# Patient Record
Sex: Male | Born: 1995 | Race: White | Hispanic: No | Marital: Single | Smoking: Current every day smoker
Health system: Southern US, Community
[De-identification: ages and names within clinical notes are randomized; demographics above are authoritative.]

## PROBLEM LIST (undated history)

## (undated) DIAGNOSIS — W5503XA Scratched by cat, initial encounter: Secondary | ICD-10-CM

## (undated) DIAGNOSIS — R12 Heartburn: Secondary | ICD-10-CM

## (undated) DIAGNOSIS — S022XXA Fracture of nasal bones, initial encounter for closed fracture: Secondary | ICD-10-CM

## (undated) HISTORY — PX: LACERATION REPAIR: SHX5168

## (undated) HISTORY — PX: HERNIA REPAIR: SHX51

## (undated) HISTORY — PX: WISDOM TOOTH EXTRACTION: SHX21

---
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
## 2014-02-09 ENCOUNTER — Emergency Department (HOSPITAL_COMMUNITY)
Admission: EM | Admit: 2014-02-09 | Discharge: 2014-02-09 | Disposition: A | Discharge status: Home / Self Care | Payer: Medicaid Other | Attending: Emergency Medicine | Admitting: Emergency Medicine

## 2014-02-09 ENCOUNTER — Encounter (HOSPITAL_COMMUNITY): Payer: Self-pay | Admitting: Emergency Medicine

## 2014-02-09 DIAGNOSIS — S01511A Laceration without foreign body of lip, initial encounter: Secondary | ICD-10-CM

## 2014-02-09 DIAGNOSIS — Y9239 Other specified sports and athletic area as the place of occurrence of the external cause: Secondary | ICD-10-CM | POA: Insufficient documentation

## 2014-02-09 DIAGNOSIS — Y9371 Activity, boxing: Secondary | ICD-10-CM | POA: Insufficient documentation

## 2014-02-09 DIAGNOSIS — S01501A Unspecified open wound of lip, initial encounter: Secondary | ICD-10-CM | POA: Insufficient documentation

## 2014-02-09 DIAGNOSIS — Z8781 Personal history of (healed) traumatic fracture: Secondary | ICD-10-CM | POA: Insufficient documentation

## 2014-02-09 DIAGNOSIS — Y92838 Other recreation area as the place of occurrence of the external cause: Secondary | ICD-10-CM

## 2014-02-09 DIAGNOSIS — W219XXA Striking against or struck by unspecified sports equipment, initial encounter: Secondary | ICD-10-CM | POA: Insufficient documentation

## 2014-02-09 DIAGNOSIS — F17200 Nicotine dependence, unspecified, uncomplicated: Secondary | ICD-10-CM | POA: Insufficient documentation

## 2014-02-09 DIAGNOSIS — Z9889 Other specified postprocedural states: Secondary | ICD-10-CM | POA: Insufficient documentation

## 2014-02-09 MED ORDER — CLINDAMYCIN HCL 150 MG PO CAPS: 300.0000 mg | ORAL_CAPSULE | Freq: Three times a day (TID) | ORAL | Status: DC

## 2014-02-09 MED ORDER — TETANUS-DIPHTH-ACELL PERTUSSIS 5-2.5-18.5 LF-MCG/0.5 IM SUSP: 0.5000 mL | Freq: Once | INTRAMUSCULAR | Status: DC

## 2014-02-09 NOTE — ED Notes (Signed)
Pt comfortable with discharge and follow up instructions. Prescriptions x1. Pt declines wheelchair, escorted to waiting area. 

## 2014-02-09 NOTE — Discharge Instructions (Signed)
You need to have your sutures removed in 5 days.  Facial Laceration  A facial laceration is a cut on the face. These injuries can be painful and cause bleeding. Lacerations usually heal quickly, but they need special care to reduce scarring. DIAGNOSIS  Your health care provider will take a medical history, ask for details about how the injury occurred, and examine the wound to determine how deep the cut is. TREATMENT  Some facial lacerations may not require closure. Others may not be able to be closed because of an increased risk of infection. The risk of infection and the chance for successful closure will depend on various factors, including the amount of time since the injury occurred. The wound may be cleaned to help prevent infection. If closure is appropriate, pain medicines may be given if needed. Your health care provider will use stitches (sutures), wound glue (adhesive), or skin adhesive strips to repair the laceration. These tools bring the skin edges together to allow for faster healing and a better cosmetic outcome. If needed, you may also be given a tetanus shot. HOME CARE INSTRUCTIONS  Only take over-the-counter or prescription medicines as directed by your health care provider.  Follow your health care provider's instructions for wound care. These instructions will vary depending on the technique used for closing the wound. For Sutures:  Keep the wound clean and dry.   If you were given a bandage (dressing), you should change it at least once a day. Also change the dressing if it becomes wet or dirty, or as directed by your health care provider.   Wash the wound with soap and water 2 times a day. Rinse the wound off with water to remove all soap. Pat the wound dry with a clean towel.   After cleaning, apply a thin layer of the antibiotic ointment recommended by your health care provider. This will help prevent infection and keep the dressing from sticking.   You may shower  as usual after the first 24 hours. Do not soak the wound in water until the sutures are removed.   Get your sutures removed as directed by your health care provider. With facial lacerations, sutures should usually be taken out after 4-5 days to avoid stitch marks.   Wait a few days after your sutures are removed before applying any makeup. For Skin Adhesive Strips:  Keep the wound clean and dry.   Do not get the skin adhesive strips wet. You may bathe carefully, using caution to keep the wound dry.   If the wound gets wet, pat it dry with a clean towel.   Skin adhesive strips will fall off on their own. You may trim the strips as the wound heals. Do not remove skin adhesive strips that are still stuck to the wound. They will fall off in time.  For Wound Adhesive:  You may briefly wet your wound in the shower or bath. Do not soak or scrub the wound. Do not swim. Avoid periods of heavy sweating until the skin adhesive has fallen off on its own. After showering or bathing, gently pat the wound dry with a clean towel.   Do not apply liquid medicine, cream medicine, ointment medicine, or makeup to your wound while the skin adhesive is in place. This may loosen the film before your wound is healed.   If a dressing is placed over the wound, be careful not to apply tape directly over the skin adhesive. This may cause the adhesive to  be pulled off before the wound is healed.   Avoid prolonged exposure to sunlight or tanning lamps while the skin adhesive is in place.  The skin adhesive will usually remain in place for 5-10 days, then naturally fall off the skin. Do not pick at the adhesive film.  After Healing: Once the wound has healed, cover the wound with sunscreen during the day for 1 full year. This can help minimize scarring. Exposure to ultraviolet light in the first year will darken the scar. It can take 1-2 years for the scar to lose its redness and to heal completely.  SEEK  IMMEDIATE MEDICAL CARE IF:  You have redness, pain, or swelling around the wound.   You see ayellowish-white fluid (pus) coming from the wound.   You have chills or a fever.  MAKE SURE YOU:  Understand these instructions.  Will watch your condition.  Will get help right away if you are not doing well or get worse. Document Released: 09/18/2004 Document Revised: 06/01/2013 Document Reviewed: 03/24/2013 [facility] Patient Information 2015 [place]. This information is not intended to replace advice given to you by your health care provider. Make sure you discuss any questions you have with your health care provider.
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
## 2014-02-09 NOTE — ED Provider Notes (Signed)
CSN: [id number]     Arrival date & time 02/09/14  1759 History  This chart was scribed for Roxy Horsemanobert Browning, PA, working with Flint MelterElliott L Wentz, MD, by Bronson CurbJacqueline Melvin, ED Scribe. This patient was seen in room TR05C/TR05C and the patient's care was started at 6:43 PM.      Chief Complaint  Patient presents with  . Lip Laceration     The history is provided by the patient. No language interpreter was used.    HPI Comments: Tommy Allen is a 18 y.o. male who presents to the Emergency Department with a chief complaint of left upper lip laceration that occurred 2 hours ago. Patient states he was boxing with some friends and describes the laceration as being through-and-through. He has not taken anything to alleviate his symptoms. There is associated controlled bleeding. Patient is uncertain of the status of his tetanus immunization, but believes he received one within the last year.   Past Medical History  Diagnosis Date  . Nasal fracture 02/26/2013  . Heartburn     occasional; no current med.  . Cat scratch of multiple sites 03/03/2013   Past Surgical History  Procedure Laterality Date  . Hernia repair Bilateral     as an infant  . Laceration repair      lip - as a child  . Wisdom tooth extraction    . Closed reduction nasal fracture N/A 03/10/2013    Procedure: CLOSED NASAL REDUCTION;  Surgeon: Wayland Denislaire Sanger, DO;  Location: [facility];  Service: Plastics;  Laterality: N/A;   No family history on file. History  Substance Use Topics  . Smoking status: Current Every Day Smoker -- 1.00 packs/day for 5 years    Types: Cigarettes  . Smokeless tobacco: Former NeurosurgeonUser  . Alcohol Use: Yes     Comment: seldom    Review of Systems  Constitutional: Negative for fever and chills.  Respiratory: Negative for shortness of breath.   Cardiovascular: Negative for chest pain.  Gastrointestinal: Negative for nausea, vomiting, diarrhea and constipation.  Genitourinary: Negative for  dysuria.  Skin: Positive for wound.      Allergies  Cefzil  Home Medications   Prior to Admission medications   Medication Sig Start Date End Date Taking? Authorizing Provider  HYDROcodone-acetaminophen (NORCO/VICODIN) 5-325 MG per tablet Take 1 tablet by mouth every 6 (six) hours as needed for pain. 02/26/13   Jamesetta Orleanshristopher W Lawyer, PA-C  ibuprofen (ADVIL,MOTRIN) 800 MG tablet Take 1 tablet (800 mg total) by mouth every 8 (eight) hours as needed for pain. 02/26/13   Carlyle Dollyhristopher W Lawyer, PA-C   Triage Vitals: BP 136/85  Pulse 101  Temp(Src) 99.3 F (37.4 C) (Oral)  Resp 18  SpO2 97%  Physical Exam  Nursing note and vitals reviewed. Constitutional: He is oriented to person, place, and time. He appears well-developed and well-nourished. No distress.  HENT:  Head: Normocephalic and atraumatic.  1 cm through and through laceration to the upper lip sparing the vermilion border  No dental trauma  Eyes: Conjunctivae and EOM are normal.  Neck: Neck supple. No tracheal deviation present.  Cardiovascular: Normal rate.   Pulmonary/Chest: Effort normal. No respiratory distress.  Musculoskeletal: Normal range of motion.  Neurological: He is alert and oriented to person, place, and time.  Skin: Skin is warm and dry.  Psychiatric: He has a normal mood and affect. His behavior is normal.    ED Course  Procedures (including critical care time)  DIAGNOSTIC STUDIES: Oxygen Saturation  is 97% on room air, adequate by my interpretation.    COORDINATION OF CARE: At 1845 Discussed treatment plan with patient which includes laceration repair. Patient agrees.    LACERATION REPAIR Performed by: Roxy HorsemanBROWNING, ROBERT Authorized by: Roxy HorsemanBROWNING, ROBERT Consent: Verbal consent obtained. Risks and benefits: risks, benefits and alternatives were discussed Consent given by: patient Patient identity confirmed: provided demographic data Prepped and Draped in normal sterile fashion Wound  explored  Laceration Location: top lip  Laceration Length: 1cm  No Foreign Bodies seen or palpated  Anesthesia: local infiltration  Local anesthetic: lidocaine 2% without epinephrine  Anesthetic total: 1 ml  Irrigation method: syringe Amount of cleaning: standard  Skin closure: 5-0 prolene  Number of sutures: 3  Technique: interrupted  Patient tolerance: Patient tolerated the procedure well with no immediate complications. LACERATION REPAIR Performed by: Roxy HorsemanBROWNING, ROBERT Authorized by: Roxy HorsemanBROWNING, ROBERT Consent: Verbal consent obtained. Risks and benefits: risks, benefits and alternatives were discussed Consent given by: patient Patient identity confirmed: provided demographic data Prepped and Draped in normal sterile fashion Wound explored  Laceration Location: oral mucosa  Laceration Length: 0.5cm  No Foreign Bodies seen or palpated  Anesthesia: local infiltration  Local anesthetic: lidocaine 2% without epinephrine  Anesthetic total: 1 ml  Irrigation method: syringe Amount of cleaning: standard  Skin closure: 5-0 vicryl rapide  Number of sutures: 1  Technique: interrupted  Patient tolerance: Patient tolerated the procedure well with no immediate complications.   Labs Review Labs Reviewed - No data to display  Imaging Review No results found.   EKG Interpretation None      MDM   Final diagnoses:  Lip laceration, initial encounter    Patient with through and through lip laceration.  No involvement of the vermilion border.  Lacerations repaired in the ED.  Will start on clinda.  Tdap up to date.  Sutures out in 5 days.  Return for new or worsening symptoms.  No other complaints.  I personally performed the services described in this documentation, which was scribed in my presence. The recorded information has been reviewed and is accurate.    Roxy Horsemanobert Browning, PA-C 02/09/14 [phone]

## 2014-02-10 NOTE — ED Provider Notes (Signed)
Medical screening examination/treatment/procedure(s) were performed by non-physician practitioner and as supervising physician I was immediately available for consultation/collaboration.   EKG Interpretation None       Elliott L Wentz, MD 02/10/14 1725 

## 2014-02-20 ENCOUNTER — Emergency Department (HOSPITAL_COMMUNITY)
Admission: EM | Admit: 2014-02-20 | Discharge: 2014-02-20 | Disposition: A | Discharge status: Home / Self Care | Payer: Medicaid Other | Attending: Emergency Medicine | Admitting: Emergency Medicine

## 2014-02-20 ENCOUNTER — Encounter (HOSPITAL_COMMUNITY): Payer: Self-pay | Admitting: Emergency Medicine

## 2014-02-20 DIAGNOSIS — Z87828 Personal history of other (healed) physical injury and trauma: Secondary | ICD-10-CM | POA: Insufficient documentation

## 2014-02-20 DIAGNOSIS — Z4802 Encounter for removal of sutures: Secondary | ICD-10-CM

## 2014-02-20 DIAGNOSIS — F17200 Nicotine dependence, unspecified, uncomplicated: Secondary | ICD-10-CM | POA: Insufficient documentation

## 2014-02-20 NOTE — ED Provider Notes (Signed)
CSN: [id number]     Arrival date & time 02/20/14  2028 History  This chart was scribed for non-physician practitioner Dalia HeadingHannah Muthersbough, PA-C working with Layla MawKristen N Ward, DO by Joaquin MusicKristina Sanchez-Matthews, ED Scribe. This patient was seen in room TR05C/TR05C and the patient's care was started at 9:37 PM .   Chief Complaint  Patient presents with  . Wound Check   The history is provided by the patient and medical records. No language interpreter was used.   HPI Comments: Tommy Allen is a 18 y.o. male who presents to the Emergency Department complaining of wound check. Pt reports having 3 sutures placed to corner of upper lip and states he misunderstood the provider and thought all the sutures would dissolve. States he seen the sutures fall out on their own. States he was brushing his teeth a few days ago, had 2 sutures fall out, but states "he feels the end of a stitch is left in his mouth". He has noticed maculopapular rash around lip. Denies recent fever, chills, nausea, and emesis.  Past Medical History  Diagnosis Date  . Nasal fracture 02/26/2013  . Heartburn     occasional; no current med.  . Cat scratch of multiple sites 03/03/2013   Past Surgical History  Procedure Laterality Date  . Hernia repair Bilateral     as an infant  . Laceration repair      lip - as a child  . Wisdom tooth extraction    . Closed reduction nasal fracture N/A 03/10/2013    Procedure: CLOSED NASAL REDUCTION;  Surgeon: Wayland Denislaire Sanger, DO;  Location: [facility];  Service: Plastics;  Laterality: N/A;   History reviewed. No pertinent family history. History  Substance Use Topics  . Smoking status: Current Some Day Smoker -- 1.00 packs/day for 5 years    Types: Cigarettes  . Smokeless tobacco: Former NeurosurgeonUser  . Alcohol Use: Yes     Comment: seldom    Review of Systems  Constitutional: Negative for fever and chills.  Gastrointestinal: Negative for nausea and vomiting.  Skin: Positive for  wound.    Allergies  Cefzil  Home Medications   Prior to Admission medications   Medication Sig Start Date End Date Taking? Authorizing Provider  clindamycin (CLEOCIN) 150 MG capsule Take 300 mg by mouth 3 (three) times daily. May dispense as 150mg  capsules. Patient not sure when he started medication 02/09/14  Yes Roxy Horsemanobert Browning, PA-C   BP 143/80  Pulse 64  Temp(Src) 98 F (36.7 C) (Oral)  Resp 15  Ht 5\' 7"  (1.702 m)  Wt 135 lb (61.236 kg)  BMI 21.14 kg/m2  SpO2 98%  Physical Exam  Nursing note and vitals reviewed. Constitutional: He is oriented to person, place, and time. He appears well-developed and well-nourished. No distress.  HENT:  Head: Normocephalic and atraumatic.  Well-healing laceration to the left upper lip with small area of swelling at the distal end.    Eyes: Conjunctivae are normal. No scleral icterus.  Neck: Normal range of motion.  Cardiovascular: Normal rate, regular rhythm, normal heart sounds and intact distal pulses.   No murmur heard. Capillary refill < 3 sec  Pulmonary/Chest: Effort normal and breath sounds normal. No respiratory distress.  Musculoskeletal: Normal range of motion. He exhibits no edema.  Neurological: He is alert and oriented to person, place, and time. He exhibits normal muscle tone. Coordination normal.  Skin: Skin is warm and dry. He is not diaphoretic.  Psychiatric: He has a normal  mood and affect.    ED Course  FOREIGN BODY REMOVAL Date/Time: 02/20/2014 10:18 PM Performed by: Dierdre ForthMUTHERSBAUGH, HANNAH Authorized by: Dierdre ForthMUTHERSBAUGH, HANNAH Consent: Verbal consent obtained. Risks and benefits: risks, benefits and alternatives were discussed Consent given by: patient Patient understanding: patient states understanding of the procedure being performed Patient consent: the patient's understanding of the procedure matches consent given Procedure consent: procedure consent matches procedure scheduled Relevant documents: relevant  documents present and verified Site marked: the operative site was marked Required items: required blood products, implants, devices, and special equipment available Patient identity confirmed: verbally with patient and arm band Time out: Immediately prior to procedure a "time out" was called to verify the correct patient, procedure, equipment, support staff and site/side marked as required. Intake: left upper lip. Anesthesia: local infiltration Local anesthetic: lidocaine 2% without epinephrine Anesthetic total: 1.5 ml Patient sedated: no Patient restrained: no Patient cooperative: yes Complexity: complex 1 objects recovered. Objects recovered: suture Post-procedure assessment: foreign body removed Patient tolerance: Patient tolerated the procedure well with no immediate complications.    DIAGNOSTIC STUDIES: Oxygen Saturation is 98% on RA, normal by my interpretation.    COORDINATION OF CARE: 9:42 PM-Discussed treatment plan which includes foreign body removal. Pt agreed to plan.   9:44 PM- Foreign body removal.    Labs Review Labs Reviewed - No data to display  Imaging Review No results found.   EKG Interpretation None     MDM   Final diagnoses:  Visit for suture removal   Tommy Allen presents for suture removal.  Sutures placed on 02/09/14.  Record review shows that 1 Vicryl Rapide suture was placed subcuticularly and 3 Prolene sutures were placed on top.  Patient reports that they 3 Prolene sutures came out on their own but he since developed a knot on the lip.  Placed in question was opened under anesthesia and suture knot from the Vicryl suture was removed without complication.  Patient concerned about scar reduction and discussed use of Mederma.    I have personally reviewed patient's vitals, nursing note and any pertinent labs or imaging. At this time, it has been determined that no acute conditions requiring further emergency intervention. The  patient/guardian have been advised of the diagnosis and plan. I reviewed all labs and imaging including any potential incidental findings. We have discussed signs and symptoms that warrant return to the ED, such as erythema or purulent drainage.  Patient/guardian has voiced understanding and agreed to follow-up with the PCP or specialist in as needed.  Vital signs are stable at discharge.   BP 143/80  Pulse 64  Temp(Src) 98 F (36.7 C) (Oral)  Resp 15  Ht 5\' 7"  (1.702 m)  Wt 135 lb (61.236 kg)  BMI 21.14 kg/m2  SpO2 98%   I personally performed the services described in this documentation, which was scribed in my presence. The recorded information has been reviewed and is accurate.    Dahlia ClientHannah Muthersbaugh, PA-C 02/20/14 2225

## 2014-02-20 NOTE — ED Notes (Signed)
Patient states he had stitches in corner of upper lip and now has "bumps" on it, but he thinks there is something in the lip.

## 2014-02-20 NOTE — ED Provider Notes (Signed)
Medical screening examination/treatment/procedure(s) were performed by non-physician practitioner and as supervising physician I was immediately available for consultation/collaboration.   EKG Interpretation None        Layla MawKristen N Ward, DO 02/20/14 2321

## 2014-02-20 NOTE — ED Notes (Signed)
Patient reports he does use a razor, and reports using it "gently over that area".  He is unsure if the stitches are still in or not.

## 2014-02-20 NOTE — Discharge Instructions (Signed)
1. Medications: usual home medications 2. Treatment: rest, drink plenty of fluids, use Mederma and sunscreen 3. Follow Up: Please followup with your primary doctor for discussion of your diagnoses and further evaluation after today's visit; if you do not have a primary care doctor use the resource guide provided to find one;     Incision Care An incision is when a surgeon cuts into your body tissues. After surgery, the incision needs to be cared for properly to prevent infection.  HOME CARE INSTRUCTIONS   Take all medicine as directed by your caregiver. Only take over-the-counter or prescription medicines for pain, discomfort, or fever as directed by your caregiver.  Do not remove your bandage (dressing) or get your incision wet until your surgeon gives you permission. In the event that your dressing becomes wet, dirty, or starts to smell, change the dressing and call your surgeon for instructions as soon as possible.  Take showers. Do not take tub baths, swim, or do anything that may soak the wound until it is healed.  Resume your normal diet and activities as directed or allowed.  Avoid lifting any weight until you are instructed otherwise.  Use anti-itch antihistamine medicine as directed by your caregiver. The wound may itch when it is healing. Do not pick or scratch at the wound.  Follow up with your caregiver for stitch (suture) or staple removal as directed.  Drink enough fluids to keep your urine clear or pale yellow. SEEK MEDICAL CARE IF:   You have redness, swelling, or increasing pain in the wound that is not controlled with medicine.  You have drainage, blood, or pus coming from the wound that lasts longer than 1 day.  You develop muscle aches, chills, or a general ill feeling.  You notice a bad smell coming from the wound or dressing.  Your wound edges separate after the sutures, staples, or skin adhesive strips have been removed.  You develop persistent nausea or  vomiting. SEEK IMMEDIATE MEDICAL CARE IF:   You have a fever.  You develop a rash.  You develop dizzy episodes or faint while standing.  You have difficulty breathing.  You develop any reaction or side effects to medicine given. MAKE SURE YOU:   Understand these instructions.  Will watch your condition.  Will get help right away if you are not doing well or get worse. Document Released: 02/28/2005 Document Revised: 11/03/2011 Document Reviewed: 12/15/2010 [facility] Patient Information 2015 [place], [place]. This information is not intended to replace advice given to you by your health care provider. Make sure you discuss any questions you have with your health care provider.

## 2015-02-06 ENCOUNTER — Encounter (HOSPITAL_COMMUNITY): Payer: Self-pay | Admitting: Emergency Medicine

## 2015-02-06 ENCOUNTER — Emergency Department (HOSPITAL_COMMUNITY)
Admission: EM | Admit: 2015-02-06 | Discharge: 2015-02-06 | Disposition: A | Discharge status: Home / Self Care | Payer: Medicaid Other | Attending: Emergency Medicine | Admitting: Emergency Medicine

## 2015-02-06 DIAGNOSIS — Z792 Long term (current) use of antibiotics: Secondary | ICD-10-CM | POA: Diagnosis not present

## 2015-02-06 DIAGNOSIS — Y929 Unspecified place or not applicable: Secondary | ICD-10-CM | POA: Insufficient documentation

## 2015-02-06 DIAGNOSIS — R590 Localized enlarged lymph nodes: Secondary | ICD-10-CM | POA: Diagnosis not present

## 2015-02-06 DIAGNOSIS — W57XXXA Bitten or stung by nonvenomous insect and other nonvenomous arthropods, initial encounter: Secondary | ICD-10-CM | POA: Insufficient documentation

## 2015-02-06 DIAGNOSIS — Y939 Activity, unspecified: Secondary | ICD-10-CM | POA: Diagnosis not present

## 2015-02-06 DIAGNOSIS — Z8781 Personal history of (healed) traumatic fracture: Secondary | ICD-10-CM | POA: Insufficient documentation

## 2015-02-06 DIAGNOSIS — Z720 Tobacco use: Secondary | ICD-10-CM | POA: Diagnosis not present

## 2015-02-06 DIAGNOSIS — S70361A Insect bite (nonvenomous), right thigh, initial encounter: Secondary | ICD-10-CM | POA: Insufficient documentation

## 2015-02-06 DIAGNOSIS — R591 Generalized enlarged lymph nodes: Secondary | ICD-10-CM

## 2015-02-06 DIAGNOSIS — R369 Urethral discharge, unspecified: Secondary | ICD-10-CM | POA: Insufficient documentation

## 2015-02-06 DIAGNOSIS — Y999 Unspecified external cause status: Secondary | ICD-10-CM | POA: Insufficient documentation

## 2015-02-06 MED ORDER — DOXYCYCLINE HYCLATE 100 MG PO CAPS: 100.0000 mg | ORAL_CAPSULE | Freq: Two times a day (BID) | ORAL | Status: DC

## 2015-02-06 NOTE — ED Notes (Signed)
Pt here for evaluation of right leg tick bite 4 days ago and lymphadenopathy of right groin. Also noted a bump on his penis. Reports multiple sexual partners and unprotected sex.

## 2015-02-06 NOTE — ED Provider Notes (Signed)
CSN: [id number]     Arrival date & time 02/06/15  1237 History  This chart was scribed for non-physician practitioner, Teressa Lower, NP, working with Mancel Bale, MD, by Ronney Lion, ED Scribe. This patient was seen in room TR05C/TR05C and the patient's care was started at 1:02 PM.    Chief Complaint  Patient presents with  . Insect Bite  . Penile Discharge  . Lymphadenopathy   The history is provided by the patient. No language interpreter was used.   HPI Comments: Tommy Allen is a 19 y.o. male who presents to the Emergency Department complaining of a tick bite on his right inner thigh after removing the tick 3-4 days ago. He also complains of an associated "bump" on his right sided groin that began around the same. He states he also noticed a nontender, nonpruritic bump on the head of his penis 2-3 days ago. Patient denies any penile discharge.  Past Medical History  Diagnosis Date  . Nasal fracture 02/26/2013  . Heartburn     occasional; no current med.  . Cat scratch of multiple sites 03/03/2013   Past Surgical History  Procedure Laterality Date  . Hernia repair Bilateral     as an infant  . Laceration repair      lip - as a child  . Wisdom tooth extraction    . Closed reduction nasal fracture N/A 03/10/2013    Procedure: CLOSED NASAL REDUCTION;  Surgeon: Wayland Denis, DO;  Location: [facility];  Service: Plastics;  Laterality: N/A;   No family history on file. History  Substance Use Topics  . Smoking status: Current Some Day Smoker -- 1.00 packs/day for 5 years    Types: Cigarettes  . Smokeless tobacco: Former Neurosurgeon  . Alcohol Use: Yes     Comment: seldom    Review of Systems  Skin: Positive for color change (redness around tick bite).  All other systems reviewed and are negative.   Allergies  Cefzil  Home Medications   Prior to Admission medications   Medication Sig Start Date End Date Taking? Authorizing Provider  clindamycin (CLEOCIN)  150 MG capsule Take 300 mg by mouth 3 (three) times daily. May dispense as  capsules. Patient not sure when he started medication 02/09/14   Roxy Horseman, PA-C   BP 129/62 mmHg  Pulse 81  Temp(Src) 98.2 F (36.8 C) (Oral)  Resp 18  Ht  (1.702 m)  SpO2 99% Physical Exam  Constitutional: He is oriented to person, place, and time. He appears well-developed and well-nourished. No distress.  HENT:  Head: Normocephalic and atraumatic.  Eyes: Conjunctivae and EOM are normal.  Neck: Neck supple. No tracheal deviation present.  Cardiovascular: Normal rate.   Pulmonary/Chest: Effort normal. No respiratory distress.  Genitourinary: Penis normal.  Musculoskeletal: Normal range of motion.  Neurological: He is alert and oriented to person, place, and time.  Skin:  Localized area of redness to the right thigh with right lymphadenopathy noted  Psychiatric: He has a normal mood and affect. His behavior is normal.  Nursing note and vitals reviewed.   ED Course  Procedures (including critical care time)  DIAGNOSTIC STUDIES: Oxygen Saturation is 99% on RA, normal by my interpretation.    COORDINATION OF CARE: 1:05 PM - Discussed treatment plan with pt at bedside which includes doxycycline, and pt agreed to plan.  MDM   Final diagnoses:  Tick bite  Lymphadenopathy   Pt given doxy and told for continued lymphadenopathy he  needs to follow up. No abnormality noted to penis  I personally performed the services described in this documentation, which was scribed in my presence. The recorded information has been reviewed and is accurate.    Teressa Lower, NP 02/06/15 1313  Mancel Bale, MD 02/07/15 [phone]

## 2015-02-06 NOTE — Discharge Instructions (Signed)
Tick Bite Information Ticks are insects that attach themselves to the skin and draw blood for food. There are various types of ticks. Common types include wood ticks and deer ticks. Most ticks live in shrubs and grassy areas. Ticks can climb onto your body when you make contact with leaves or grass where the tick is waiting. The most common places on the body for ticks to attach themselves are the scalp, neck, armpits, waist, and groin. Most tick bites are harmless, but sometimes ticks carry germs that cause diseases. These germs can be spread to a person during the tick's feeding process. The chance of a disease spreading through a tick bite depends on:   The type of tick.  Time of year.   How long the tick is attached.   Geographic location.  HOW CAN YOU PREVENT TICK BITES? Take these steps to help prevent tick bites when you are outdoors:  Wear protective clothing. Long sleeves and long pants are best.   Wear white clothes so you can see ticks more easily.  Tuck your pant legs into your socks.   If walking on a trail, stay in the middle of the trail to avoid brushing against bushes.  Avoid walking through areas with long grass.  Put insect repellent on all exposed skin and along boot tops, pant legs, and sleeve cuffs.   Check clothing, hair, and skin repeatedly and before going inside.   Brush off any ticks that are not attached.  Take a shower or bath as soon as possible after being outdoors.  WHAT IS THE PROPER WAY TO REMOVE A TICK? Ticks should be removed as soon as possible to help prevent diseases caused by tick bites. 1. If latex gloves are available, put them on before trying to remove a tick.  2. Using fine-point tweezers, grasp the tick as close to the skin as possible. You may also use curved forceps or a tick removal tool. Grasp the tick as close to its head as possible. Avoid grasping the tick on its body. 3. Pull gently with steady upward pressure until  the tick lets go. Do not twist the tick or jerk it suddenly. This may break off the tick's head or mouth parts. 4. Do not squeeze or crush the tick's body. This could force disease-carrying fluids from the tick into your body.  5. After the tick is removed, wash the bite area and your hands with soap and water or other disinfectant such as alcohol. 6. Apply a small amount of antiseptic cream or ointment to the bite site.  7. Wash and disinfect any instruments that were used.  Do not try to remove a tick by applying a hot match, petroleum jelly, or fingernail polish to the tick. These methods do not work and may increase the chances of disease being spread from the tick bite.  WHEN SHOULD YOU SEEK MEDICAL CARE? Contact your health care provider if you are unable to remove a tick from your skin or if a part of the tick breaks off and is stuck in the skin.  After a tick bite, you need to be aware of signs and symptoms that could be related to diseases spread by ticks. Contact your health care provider if you develop any of the following in the days or weeks after the tick bite:  Unexplained fever.  Rash. A circular rash that appears days or weeks after the tick bite may indicate the possibility of Lyme disease. The rash may resemble   a target with a bull's-eye and may occur at a different part of your body than the tick bite.  Redness and swelling in the area of the tick bite.   Tender, swollen lymph glands.   Diarrhea.   Weight loss.   Cough.   Fatigue.   Muscle, joint, or bone pain.   Abdominal pain.   Headache.   Lethargy or a change in your level of consciousness.  Difficulty walking or moving your legs.   Numbness in the legs.   Paralysis.  Shortness of breath.   Confusion.   Repeated vomiting.  Document Released: 08/08/2000 Document Revised: 06/01/2013 Document Reviewed: 01/19/2013 [employer] Patient Information 2015 [employer]. This information is  not intended to replace advice given to you by your health care provider. Make sure you discuss any questions you have with your health care provider.  

## 2015-03-24 ENCOUNTER — Encounter (HOSPITAL_COMMUNITY): Payer: Self-pay | Admitting: Emergency Medicine

## 2015-03-24 ENCOUNTER — Emergency Department (HOSPITAL_COMMUNITY)
Admission: EM | Admit: 2015-03-24 | Discharge: 2015-03-24 | Disposition: A | Discharge status: Home / Self Care | Payer: Medicaid Other | Attending: Emergency Medicine | Admitting: Emergency Medicine

## 2015-03-24 DIAGNOSIS — Z8781 Personal history of (healed) traumatic fracture: Secondary | ICD-10-CM | POA: Diagnosis not present

## 2015-03-24 DIAGNOSIS — Z720 Tobacco use: Secondary | ICD-10-CM | POA: Diagnosis not present

## 2015-03-24 DIAGNOSIS — Z87828 Personal history of other (healed) physical injury and trauma: Secondary | ICD-10-CM | POA: Diagnosis not present

## 2015-03-24 DIAGNOSIS — Z202 Contact with and (suspected) exposure to infections with a predominantly sexual mode of transmission: Secondary | ICD-10-CM | POA: Diagnosis present

## 2015-03-24 DIAGNOSIS — A6000 Herpesviral infection of urogenital system, unspecified: Secondary | ICD-10-CM | POA: Diagnosis not present

## 2015-03-24 DIAGNOSIS — Z792 Long term (current) use of antibiotics: Secondary | ICD-10-CM | POA: Insufficient documentation

## 2015-03-24 LAB — URINALYSIS, ROUTINE W REFLEX MICROSCOPIC
Bilirubin Urine: NEGATIVE
GLUCOSE, UA: NEGATIVE mg/dL
Hgb urine dipstick: NEGATIVE
Ketones, ur: NEGATIVE mg/dL
LEUKOCYTES UA: NEGATIVE
Nitrite: NEGATIVE
PH: 7.5 (ref 5.0–8.0)
PROTEIN: NEGATIVE mg/dL
SPECIFIC GRAVITY, URINE: 1.02 (ref 1.005–1.030)
UROBILINOGEN UA: 1 mg/dL (ref 0.0–1.0)

## 2015-03-24 MED ORDER — ACYCLOVIR 200 MG PO CAPS
400.0000 mg | ORAL_CAPSULE | Freq: Once | ORAL | Status: DC
  Filled 2015-03-24: qty 2

## 2015-03-24 MED ORDER — ACYCLOVIR 400 MG PO TABS: 400.0000 mg | ORAL_TABLET | Freq: Four times a day (QID) | ORAL | Status: DC

## 2015-03-24 NOTE — Discharge Instructions (Signed)
Genital Herpes °Genital herpes is a sexually transmitted disease. This means that it is a disease passed by having sex with an infected person. There is no cure for genital herpes. The time between attacks can be months to years. The virus may live in a person but produce no problems (symptoms). This infection can be passed to a baby as it travels down the birth canal (vagina). In a newborn, this can cause central nervous system damage, eye damage, or even death. The virus that causes genital herpes is usually HSV-2 virus. The virus that causes oral herpes is usually HSV-1. The diagnosis (learning what is wrong) is made through culture results. °SYMPTOMS  °Usually symptoms of pain and itching begin a few days to a week after contact. It first appears as small blisters that progress to small painful ulcers which then scab over and heal after several days. It affects the outer genitalia, birth canal, cervix, penis, anal area, buttocks, and thighs. °HOME CARE INSTRUCTIONS  °· Keep ulcerated areas dry and clean. °· Take medications as directed. Antiviral medications can speed up healing. They will not prevent recurrences or cure this infection. These medications can also be taken for suppression if there are frequent recurrences. °· While the infection is active, it is contagious. Avoid all sexual contact during active infections. °· Condoms may help prevent spread of the herpes virus. °· Practice safe sex. °· Wash your hands thoroughly after touching the genital area. °· Avoid touching your eyes after touching your genital area. °· Inform your caregiver if you have had genital herpes and become pregnant. It is your responsibility to insure a safe outcome for your baby in this pregnancy. °· Only take over-the-counter or prescription medicines for pain, discomfort, or fever as directed by your caregiver. °SEEK MEDICAL CARE IF:  °· You have a recurrence of this infection. °· You do not respond to medications and are not  improving. °· You have new sources of pain or discharge which have changed from the original infection. °· You have an oral temperature above 102° F (38.9° C). °· You develop abdominal pain. °· You develop eye pain or signs of eye infection. °Document Released: 08/08/2000 Document Revised: 11/03/2011 Document Reviewed: 08/29/2009 °Exit[employer]® Patient Information ©2015 [employer]. This information is not intended to replace advice given to you by your health care provider. Make sure you discuss any questions you have with your health care provider. ° °

## 2015-03-24 NOTE — ED Notes (Signed)
Pt. requesting STD screening , noticed a " bump" at penis this morning , denies pain or itching / no penile discharge .

## 2015-03-24 NOTE — ED Provider Notes (Signed)
CSN: [id number]     Arrival date & time 03/24/15  4098 History  This chart was scribed for Tommy Helper, PA-C, working with Eber Hong, MD by Chestine Spore, ED Scribe. The patient was seen in room TR08C/TR08C at 7:48 PM.    Chief Complaint  Patient presents with  . Exposure to STD      The history is provided by the patient. No language interpreter was used.    HPI Comments: Tommy Allen is a 19 y.o. male who presents to the Emergency Department complaining of exposure onset this morning. Pt notes that he noticed a bump to his penis without pain or itching. Pt notes that he slept with a male a couple months ago and noticed the area today. Pt reports that in the past 6 months he has had 3 sexual partners with no protection. Pt would like to have a STD screening at this time. Pt reports that [facility] for [facility] check ups. He denies penile discharge, fever, chills, dysuria, hematuria, penile discharge, and any other symptoms.    Past Medical History  Diagnosis Date  . Nasal fracture 02/26/2013  . Heartburn     occasional; no current med.  . Cat scratch of multiple sites 03/03/2013   Past Surgical History  Procedure Laterality Date  . Hernia repair Bilateral     as an infant  . Laceration repair      lip - as a child  . Wisdom tooth extraction    . Closed reduction nasal fracture N/A 03/10/2013    Procedure: CLOSED NASAL REDUCTION;  Surgeon: Wayland Denis, DO;  Location: [facility];  Service: Plastics;  Laterality: N/A;   No family history on file. History  Substance Use Topics  . Smoking status: Current Every Day Smoker -- 1.00 packs/day for 5 years    Types: Cigarettes  . Smokeless tobacco: Former Neurosurgeon  . Alcohol Use: Yes     Comment: seldom    Review of Systems  Constitutional: Negative for fever and chills.  Genitourinary: Positive for genital sores. Negative for dysuria, hematuria, discharge, penile swelling,  scrotal swelling, penile pain and testicular pain.      Allergies  Cefzil  Home Medications   Prior to Admission medications   Medication Sig Start Date End Date Taking? Authorizing Provider  clindamycin (CLEOCIN) 150 MG capsule Take 300 mg by mouth 3 (three) times daily. May dispense as  capsules. Patient not sure when he started medication 02/09/14   Roxy Horseman, PA-C  doxycycline (VIBRAMYCIN) 100 MG capsule Take 1 capsule (100 mg total) by mouth 2 (two) times daily. 02/06/15   Teressa Lower, NP   BP 131/56 mmHg  Pulse 55  Temp(Src) 98.5 F (36.9 C) (Oral)  Resp 14  SpO2 99% Physical Exam  Constitutional: He is oriented to person, place, and time. He appears well-developed and well-nourished. No distress.  HENT:  Head: Normocephalic and atraumatic.  Eyes: EOM are normal.  Neck: Neck supple. No tracheal deviation present.  Cardiovascular: Normal rate.   Pulmonary/Chest: Effort normal. No respiratory distress.  Abdominal: There is no tenderness.  Genitourinary: Right testis shows no swelling and no tenderness. Left testis shows no swelling and no tenderness. Circumcised.  2 mm vesicular lesion noted to dorsum of penile shaft. Non-tender and non-ulcerated. Bilateral inguinal LAD noted. Testicle non tender with normal lie and no swelling.  Musculoskeletal: Normal range of motion.  Lymphadenopathy:  Right: Inguinal adenopathy present.       Left: Inguinal adenopathy present.  Neurological: He is alert and oriented to person, place, and time.  Skin: Skin is warm and dry.  Psychiatric: He has a normal mood and affect. His behavior is normal.  Nursing note and vitals reviewed.   ED Course  Procedures (including critical care time) DIAGNOSTIC STUDIES: Oxygen Saturation is 99% on RA, nl by my interpretation.    COORDINATION OF CARE: 7:52 PM-lesion on penile shaft concerning for genital herpes.  Discussed treatment plan which includes RPR, HIV antibody, UA,  GC/Chlam probe, Acyclovir Rx with pt at bedside and pt agreed to plan.   Labs Review Labs Reviewed  URINALYSIS, ROUTINE W REFLEX MICROSCOPIC (NOT AT [facility])  RPR  HIV ANTIBODY (ROUTINE TESTING)  GC/CHLAMYDIA PROBE AMP ([place]) NOT AT [facility]    Imaging Review No results found.   EKG Interpretation None      MDM   Final diagnoses:  Genital herpes    BP 131/56 mmHg  Pulse 55  Temp(Src) 98.5 F (36.9 C) (Oral)  Resp 14  SpO2 99%  I personally performed the services described in this documentation, which was scribed in my presence. The recorded information has been reviewed and is accurate.     Tommy Helper, PA-C 03/24/15 2124  Eber Hong, MD 03/25/15 [phone]

## 2015-03-25 LAB — RPR: RPR: NONREACTIVE

## 2015-03-25 LAB — HIV ANTIBODY (ROUTINE TESTING W REFLEX): HIV Screen 4th Generation wRfx: NONREACTIVE

## 2015-03-26 LAB — GC/CHLAMYDIA PROBE AMP (~~LOC~~) NOT AT ARMC
Chlamydia: NEGATIVE
Neisseria Gonorrhea: NEGATIVE

## 2018-05-10 DIAGNOSIS — S61259A Open bite of unspecified finger without damage to nail, initial encounter: Secondary | ICD-10-CM | POA: Insufficient documentation

## 2018-07-19 DIAGNOSIS — L608 Other nail disorders: Secondary | ICD-10-CM | POA: Insufficient documentation

## 2019-03-06 ENCOUNTER — Emergency Department (HOSPITAL_COMMUNITY)
Admission: EM | Admit: 2019-03-06 | Discharge: 2019-03-07 | Disposition: A | Discharge status: Home / Self Care | Payer: BC Managed Care – PPO | Attending: Emergency Medicine | Admitting: Emergency Medicine

## 2019-03-06 ENCOUNTER — Other Ambulatory Visit: Payer: Self-pay

## 2019-03-06 DIAGNOSIS — M542 Cervicalgia: Secondary | ICD-10-CM | POA: Insufficient documentation

## 2019-03-06 DIAGNOSIS — F17210 Nicotine dependence, cigarettes, uncomplicated: Secondary | ICD-10-CM | POA: Insufficient documentation

## 2019-03-06 DIAGNOSIS — M25551 Pain in right hip: Secondary | ICD-10-CM | POA: Diagnosis not present

## 2019-03-06 DIAGNOSIS — M25552 Pain in left hip: Secondary | ICD-10-CM | POA: Diagnosis not present

## 2019-03-06 DIAGNOSIS — Y92410 Unspecified street and highway as the place of occurrence of the external cause: Secondary | ICD-10-CM | POA: Insufficient documentation

## 2019-03-06 DIAGNOSIS — Y998 Other external cause status: Secondary | ICD-10-CM | POA: Diagnosis not present

## 2019-03-06 DIAGNOSIS — M25572 Pain in left ankle and joints of left foot: Secondary | ICD-10-CM | POA: Insufficient documentation

## 2019-03-06 DIAGNOSIS — F1210 Cannabis abuse, uncomplicated: Secondary | ICD-10-CM | POA: Diagnosis not present

## 2019-03-06 DIAGNOSIS — Y9355 Activity, bike riding: Secondary | ICD-10-CM | POA: Insufficient documentation

## 2019-03-06 DIAGNOSIS — M25562 Pain in left knee: Secondary | ICD-10-CM | POA: Diagnosis not present
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
## 2019-03-07 ENCOUNTER — Emergency Department (HOSPITAL_COMMUNITY): Payer: BC Managed Care – PPO

## 2019-03-07 MED ORDER — IBUPROFEN 800 MG PO TABS: 800.0000 mg | ORAL_TABLET | Freq: Three times a day (TID) | ORAL | 0 refills | Status: DC

## 2019-03-07 NOTE — ED Notes (Signed)
Pt ambulated from lobby to room with no difficulty or assistance
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
## 2019-03-07 NOTE — ED Notes (Signed)
Patient transported to X-ray 

## 2019-05-03 ENCOUNTER — Encounter (HOSPITAL_COMMUNITY): Payer: Self-pay | Admitting: Emergency Medicine

## 2019-05-03 ENCOUNTER — Other Ambulatory Visit: Payer: Self-pay

## 2019-05-03 ENCOUNTER — Emergency Department (HOSPITAL_COMMUNITY)
Admission: EM | Admit: 2019-05-03 | Discharge: 2019-05-03 | Disposition: A | Discharge status: Home / Self Care | Payer: BC Managed Care – PPO | Attending: Emergency Medicine | Admitting: Emergency Medicine

## 2019-05-03 DIAGNOSIS — Y92410 Unspecified street and highway as the place of occurrence of the external cause: Secondary | ICD-10-CM | POA: Diagnosis not present

## 2019-05-03 DIAGNOSIS — M62830 Muscle spasm of back: Secondary | ICD-10-CM | POA: Insufficient documentation

## 2019-05-03 DIAGNOSIS — Y998 Other external cause status: Secondary | ICD-10-CM | POA: Insufficient documentation

## 2019-05-03 DIAGNOSIS — M549 Dorsalgia, unspecified: Secondary | ICD-10-CM | POA: Diagnosis present

## 2019-05-03 DIAGNOSIS — Y9389 Activity, other specified: Secondary | ICD-10-CM | POA: Insufficient documentation

## 2019-05-03 DIAGNOSIS — F17210 Nicotine dependence, cigarettes, uncomplicated: Secondary | ICD-10-CM | POA: Diagnosis not present

## 2019-05-03 DIAGNOSIS — F1210 Cannabis abuse, uncomplicated: Secondary | ICD-10-CM | POA: Insufficient documentation

## 2019-05-03 MED ORDER — METHOCARBAMOL 500 MG PO TABS: 500.0000 mg | ORAL_TABLET | Freq: Two times a day (BID) | ORAL | 0 refills | Status: DC
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
## 2019-05-03 NOTE — ED Notes (Signed)
Patient Alert and oriented to baseline. Stable and ambulatory to baseline. Patient verbalized understanding of the discharge instructions.  Patient belongings were taken by the patient.   

## 2019-05-03 NOTE — Discharge Instructions (Signed)
Take the prescribed medication as directed.  Can also use over the counter tylenol/motrin or heating pad, warm compress, etc. Follow-up with your primary care doctor. Return to the ED for new or worsening symptoms.

## 2020-01-24 DIAGNOSIS — N509 Disorder of male genital organs, unspecified: Secondary | ICD-10-CM | POA: Insufficient documentation

## 2020-05-05 ENCOUNTER — Other Ambulatory Visit: Payer: Self-pay

## 2020-05-05 ENCOUNTER — Encounter: Payer: Self-pay | Admitting: Emergency Medicine

## 2020-05-05 ENCOUNTER — Emergency Department (INDEPENDENT_AMBULATORY_CARE_PROVIDER_SITE_OTHER)
Admission: EM | Admit: 2020-05-05 | Discharge: 2020-05-05 | Disposition: A | Discharge status: Home / Self Care | Payer: BC Managed Care – PPO | Source: Home / Self Care

## 2020-05-05 DIAGNOSIS — H168 Other keratitis: Secondary | ICD-10-CM | POA: Diagnosis not present

## 2020-05-05 MED ORDER — ARTIFICIAL TEARS OPHTHALMIC OINT: TOPICAL_OINTMENT | OPHTHALMIC | 1 refills | Status: AC | PRN
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
## 2020-05-05 NOTE — Discharge Instructions (Addendum)
Follow-up with Dr. Groat 

## 2020-05-05 NOTE — ED Triage Notes (Signed)
Eye pain x 1+ month  Lasik done 4 years ago  Does not wear contacts or glasses Describes vision as fuzzy Wakes up with painful dry eyes L>R PCP treated pt for pink eye 3 weeks ago - helped short term No COVID vaccine
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
## 2022-06-04 ENCOUNTER — Ambulatory Visit
Admission: EM | Admit: 2022-06-04 | Discharge: 2022-06-04 | Disposition: A | Discharge status: Home / Self Care | Payer: BC Managed Care – PPO | Attending: Emergency Medicine | Admitting: Emergency Medicine

## 2022-06-04 DIAGNOSIS — S91332A Puncture wound without foreign body, left foot, initial encounter: Secondary | ICD-10-CM

## 2022-06-04 DIAGNOSIS — Z23 Encounter for immunization: Secondary | ICD-10-CM

## 2022-06-04 MED ORDER — TETANUS-DIPHTH-ACELL PERTUSSIS 5-2.5-18.5 LF-MCG/0.5 IM SUSY
0.5000 mL | PREFILLED_SYRINGE | Freq: Once | INTRAMUSCULAR | Status: AC
  Administered 2022-06-04: 0.5 mL via INTRAMUSCULAR

## 2022-06-04 MED ORDER — SULFAMETHOXAZOLE-TRIMETHOPRIM 800-160 MG PO TABS: 1.0000 | ORAL_TABLET | Freq: Two times a day (BID) | ORAL | 0 refills | Status: AC

## 2022-06-04 NOTE — Discharge Instructions (Addendum)
Your tetanus was updated today.    Take the antibiotic as directed.    Keep your wound clean and dry.  Wash it gently twice a day with soap and water.  Apply an antibiotic cream twice a day.    Follow up right away if you see signs of infection, such as increased pain, redness, pus-like drainage, warmth, fever, chills, or other concerning symptoms.

## 2022-06-04 NOTE — ED Triage Notes (Signed)
Patient to Urgent Care with complaints of left sided foot (heel) pain after stepping on a nail this afternoon. Nail went through shoe, patient was able to remove nail.  Last tdap shot 2015.
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
## 2023-09-22 ENCOUNTER — Ambulatory Visit
Admission: EM | Admit: 2023-09-22 | Discharge: 2023-09-22 | Disposition: A | Discharge status: Home / Self Care | Payer: Self-pay | Attending: Emergency Medicine | Admitting: Emergency Medicine

## 2023-09-22 DIAGNOSIS — H6503 Acute serous otitis media, bilateral: Secondary | ICD-10-CM

## 2023-09-22 MED ORDER — AMOXICILLIN-POT CLAVULANATE 875-125 MG PO TABS: 1.0000 | ORAL_TABLET | Freq: Two times a day (BID) | ORAL | 0 refills | Status: AC

## 2023-09-22 MED ORDER — BENZONATATE 100 MG PO CAPS: 100.0000 mg | ORAL_CAPSULE | Freq: Three times a day (TID) | ORAL | 0 refills | Status: AC

## 2023-09-22 MED ORDER — PROMETHAZINE-DM 6.25-15 MG/5ML PO SYRP: 5.0000 mL | ORAL_SOLUTION | Freq: Every evening | ORAL | 0 refills | Status: AC | PRN

## 2023-09-22 NOTE — ED Triage Notes (Addendum)
Patient to Urgent Care with complaints of body aches/ headaches/ chills/ diarrhea. Reports he is having coughing fits- cough is intermittently productive.   Reports symptoms started 8 days ago. Tested negative for flu/ Covid.   Dayquil/ nyquil/ cough suppressant.

## 2023-09-22 NOTE — Discharge Instructions (Signed)
Today you are being treated for an infection of the eardrum  Take amoxicillin twice daily for 10 days, you should begin to see improvement after 48 hours of medication use and then it should progressively get better  Use Tessalon pill every 8 hours as needed for coughing, may use cough syrup at bedtime for additional comfort  You may use Tylenol or ibuprofen for management of discomfort  May hold warm compresses to the ear for additional comfort  Please not attempted any ear cleaning or object or fluid placement into the ear canal to prevent further irritation    For cough: honey 1/2 to 1 teaspoon (you can dilute the honey in water or another fluid).  You can also use guaifenesin and dextromethorphan for cough. You can use a humidifier for chest congestion and cough.  If you don't have a humidifier, you can sit in the bathroom with the hot shower running.      For sore throat: try warm salt water gargles, cepacol lozenges, throat spray, warm tea or water with lemon/honey, popsicles or ice, or OTC cold relief medicine for throat discomfort.   For congestion: take a daily anti-histamine like Zyrtec, Claritin, and a oral decongestant, such as pseudoephedrine.  You can also use Flonase 1-2 sprays in each nostril daily.   It is important to stay hydrated: drink plenty of fluids (water, gatorade/powerade/pedialyte, juices, or teas) to keep your throat moisturized and help further relieve irritation/discomfort.
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
